# Patient Record
Sex: Female | Born: 1955 | Race: White | Hispanic: No | Marital: Married | Smoking: Never smoker
Health system: Southern US, Community
[De-identification: ages and names within clinical notes are randomized; demographics above are authoritative.]

## PROBLEM LIST (undated history)

## (undated) DIAGNOSIS — G43909 Migraine, unspecified, not intractable, without status migrainosus: Secondary | ICD-10-CM

## (undated) HISTORY — PX: ABDOMINAL HYSTERECTOMY: SHX81

## (undated) HISTORY — PX: ABDOMINAL SURGERY: SHX537

---

## 2016-02-28 ENCOUNTER — Encounter: Payer: Self-pay | Admitting: Emergency Medicine

## 2016-02-28 ENCOUNTER — Emergency Department
Admission: EM | Admit: 2016-02-28 | Discharge: 2016-02-28 | Disposition: A | Discharge status: Home / Self Care | Payer: BLUE CROSS/BLUE SHIELD | Source: Home / Self Care | Attending: Family Medicine | Admitting: Family Medicine

## 2016-02-28 DIAGNOSIS — J019 Acute sinusitis, unspecified: Secondary | ICD-10-CM | POA: Diagnosis not present

## 2016-02-28 DIAGNOSIS — R059 Cough, unspecified: Secondary | ICD-10-CM

## 2016-02-28 DIAGNOSIS — R05 Cough: Secondary | ICD-10-CM

## 2016-02-28 HISTORY — DX: Migraine, unspecified, not intractable, without status migrainosus: G43.909

## 2016-02-28 MED ORDER — BENZONATATE 100 MG PO CAPS: 100.0000 mg | ORAL_CAPSULE | Freq: Three times a day (TID) | ORAL | 0 refills | Status: DC

## 2016-02-28 MED ORDER — AMOXICILLIN 500 MG PO CAPS: 500.0000 mg | ORAL_CAPSULE | Freq: Three times a day (TID) | ORAL | 0 refills | Status: DC

## 2016-02-28 MED ORDER — IPRATROPIUM BROMIDE 0.06 % NA SOLN: 2.0000 | Freq: Four times a day (QID) | NASAL | 12 refills | Status: DC

## 2016-02-28 NOTE — ED Provider Notes (Signed)
CSN: [id number]     Arrival date & time 02/28/16  0845 History   First MD Initiated Contact with Patient 02/28/16 [phone]     Chief Complaint  Patient presents with  . Sinus Problem   (Consider location/radiation/quality/duration/timing/severity/associated sxs/prior Treatment) HPI Dominique Watts is a 60 y.o. female presenting to UC with c/o 1 week of gradually worsening sinus congestion with rhinorrhea, yellow to green mucous, facial pain and pressure with HA on Left side behind her eye.  Mild intermittent productive cough for 1 week.  She has tried OTC nighttime cough/cold medication and tylenol during the day with mild relief. Denies fever, chills, n/v/d.  She believes she may have had a sinus infection in the past, however, she notes she rarely gets sick.  Her husband however has been sick for the last month.    Past Medical History:  Diagnosis Date  . Migraine    Past Surgical History:  Procedure Laterality Date  . ABDOMINAL HYSTERECTOMY    . ABDOMINAL SURGERY     Family History  Problem Relation Age of Onset  . Cancer Mother   . Hypertension Father    Social History  Substance Use Topics  . Smoking status: Never Smoker  . Smokeless tobacco: Never Used  . Alcohol use No   OB History    No data available     Review of Systems  Constitutional: Negative for chills and fever.  HENT: Positive for congestion, ear pain (pressure), postnasal drip, rhinorrhea, sinus pain, sinus pressure and sore throat. Negative for trouble swallowing and voice change.   Respiratory: Positive for cough. Negative for shortness of breath.   Cardiovascular: Negative for chest pain and palpitations.  Gastrointestinal: Negative for abdominal pain, diarrhea, nausea and vomiting.  Musculoskeletal: Negative for arthralgias, back pain and myalgias.  Skin: Negative for rash.  Neurological: Positive for headaches. Negative for dizziness and light-headedness.    Allergies  Patient has no allergy  information on record.  Home Medications   Prior to Admission medications   Medication Sig Start Date End Date Taking? Authorizing Provider  buPROPion (WELLBUTRIN SR) 150 MG 12 hr tablet Take 150 mg by mouth 2 (two) times daily.   Yes Historical Provider, MD  estrogens, conjugated, (PREMARIN) 0.3 MG tablet Take 0.3 mg by mouth daily. Take daily for 21 days then do not take for 7 days.   Yes Historical Provider, MD  propranolol (INNOPRAN XL) 120 MG 24 hr capsule Take 120 mg by mouth at bedtime.   Yes Historical Provider, MD  amoxicillin (AMOXIL) 500 MG capsule Take 1 capsule (500 mg total) by mouth 3 (three) times daily. 02/28/16   Junius FinnerErin O'Malley, PA-C  benzonatate (TESSALON) 100 MG capsule Take 1-2 capsules (100-200 mg total) by mouth every 8 (eight) hours. 02/28/16   Junius FinnerErin O'Malley, PA-C  ipratropium (ATROVENT) 0.06 % nasal spray Place 2 sprays into both nostrils 4 (four) times daily. 02/28/16   Junius FinnerErin O'Malley, PA-C   Meds Ordered and Administered this Visit  Medications - No data to display  BP 134/82 (BP Location: Left Arm)   Pulse (!) 58   Temp 98.4 F (36.9 C) (Oral)   Ht 5\' 3"  (1.6 m)   Wt 160 lb (72.6 kg)   SpO2 99%   BMI 28.34 kg/m  No data found.   Physical Exam  Constitutional: She appears well-developed and well-nourished. No distress.  HENT:  Head: Normocephalic and atraumatic.  Right Ear: Tympanic membrane normal.  Left Ear: Tympanic membrane normal.  Nose:  Mucosal edema present. Right sinus exhibits maxillary sinus tenderness. Right sinus exhibits no frontal sinus tenderness. Left sinus exhibits maxillary sinus tenderness. Left sinus exhibits no frontal sinus tenderness.  Mouth/Throat: Uvula is midline, oropharynx is clear and moist and mucous membranes are normal.  Eyes: Conjunctivae are normal. No scleral icterus.  Neck: Normal range of motion. Neck supple.  Cardiovascular: Normal rate, regular rhythm and normal heart sounds.   Pulmonary/Chest: Effort normal and  breath sounds normal. No stridor. No respiratory distress. She has no wheezes. She has no rales.  Musculoskeletal: Normal range of motion.  Lymphadenopathy:    She has no cervical adenopathy.  Neurological: She is alert.  Skin: Skin is warm and dry. She is not diaphoretic.  Nursing note and vitals reviewed.   Urgent Care Course   Clinical Course     Procedures (including critical care time)  Labs Review Labs Reviewed - No data to display  Imaging Review No results found.    MDM   1. Acute rhinosinusitis   2. Cough    Pt c/o 1 week of worsening sinus and URI symptoms. Sinus tenderness noted on exam. Will  Cover for bacterial cause.  Rx: Amoxicillin , ipratropium nasal spray, and tessalon  Encouraged f/u with PCP In 1 week if not improving, sooner if significantly worsening.   Junius Finnerrin O'Malley, PA-C 02/28/16 1319

## 2016-02-28 NOTE — ED Triage Notes (Signed)
Sinus congestion, runny nose, yellow to green mucus, facial pain and pressure, headache behind left eye, cough x 1 week

## 2019-06-21 ENCOUNTER — Encounter: Payer: Self-pay | Admitting: Emergency Medicine

## 2019-06-21 ENCOUNTER — Emergency Department
Admission: EM | Admit: 2019-06-21 | Discharge: 2019-06-21 | Disposition: A | Discharge status: Home / Self Care | Payer: Medicare HMO | Source: Home / Self Care

## 2019-06-21 ENCOUNTER — Emergency Department (INDEPENDENT_AMBULATORY_CARE_PROVIDER_SITE_OTHER): Payer: Medicare HMO

## 2019-06-21 ENCOUNTER — Other Ambulatory Visit: Payer: Self-pay

## 2019-06-21 DIAGNOSIS — S92351A Displaced fracture of fifth metatarsal bone, right foot, initial encounter for closed fracture: Secondary | ICD-10-CM

## 2019-06-21 DIAGNOSIS — M79671 Pain in right foot: Secondary | ICD-10-CM

## 2019-06-21 MED ORDER — TRAMADOL HCL 50 MG PO TABS: 50.0000 mg | ORAL_TABLET | Freq: Four times a day (QID) | ORAL | 0 refills | Status: AC | PRN

## 2019-06-21 NOTE — Discharge Instructions (Addendum)
  You have a broken bone in your foot. Please wear the boot at all times and remain non-weight bearing until you follow up with Sports Medicine later this week or early next week.  Please call today or tomorrow to schedule an appointment with Sports Medicine later this week or early next week.  You may remove the boot to bath, try sitting on a chair in your shower or in a bath so you do not slip and fall. You may also remove the boot 2-3 times daily to apply a cool compress and elevate your foot to help with pain and swelling.   You may take 500mg  acetaminophen every 4-6 hours or in combination with ibuprofen 400-600mg  every 6-8 hours as needed for pain and inflammation.

## 2019-06-21 NOTE — ED Triage Notes (Signed)
RT foot injury last night steeped off the curb and rolled her foot.

## 2019-06-21 NOTE — ED Provider Notes (Signed)
[facility]    CSN: [id number] Arrival date & time: 06/21/19  1231      History   Chief Complaint Chief Complaint  Patient presents with  . Foot Injury    HPI Dominique Watts is a 64 y.o. female.   HPI  Dominique Watts is a 64 y.o. female presenting to UC with c/o sudden onset Right foot pain and swelling along the lateral aspect of her foot after rolling her foot on a curb last night. She has tried ice, elevation, tylenol, ibuprofen, and excedrin w/o relief. Pain made it difficult for her to sleep. No prior hx of foot fracture in same foot.  Pain is 8/10, worse with ambulation.    Past Medical History:  Diagnosis Date  . Migraine     There are no problems to display for this patient.   Past Surgical History:  Procedure Laterality Date  . ABDOMINAL HYSTERECTOMY    . ABDOMINAL SURGERY      OB History   No obstetric history on file.      Home Medications    Prior to Admission medications   Medication Sig Start Date End Date Taking? Authorizing Provider  cyanocobalamin 1000 MCG tablet Take 1,000 mcg by mouth daily.   Yes [provider]  losartan-hydrochlorothiazide (HYZAAR) 100-12.5 MG tablet Take 1 tablet by mouth daily.   Yes [provider]  buPROPion (WELLBUTRIN SR) 150 MG 12 hr tablet Take 150 mg by mouth 2 (two) times daily.    [provider]  propranolol (INNOPRAN XL) 120 MG 24 hr capsule Take 120 mg by mouth at bedtime.    [provider]  traMADol (ULTRAM) 50 MG tablet Take 1 tablet (50 mg total) by mouth every 6 (six) hours as needed. 06/21/19   Lurene Shadow, PA-C    Family History Family History  Problem Relation Age of Onset  . Cancer Mother   . Hypertension Father     Social History Social History   Tobacco Use  . Smoking status: Never Smoker  . Smokeless tobacco: Never Used  Substance Use Topics  . Alcohol use: No  . Drug use: No     Allergies   Bee venom   Review of Systems Review  of Systems  Musculoskeletal: Positive for arthralgias, gait problem and joint swelling.  Skin: Negative for color change and wound.     Physical Exam Triage Vital Signs ED Triage Vitals  Enc Vitals Group     BP 06/21/19 1251 137/80     Pulse Rate 06/21/19 1251 (!) 58     Resp --      Temp 06/21/19 1251 98.3 F (36.8 C)     Temp Source 06/21/19 1251 Oral     SpO2 06/21/19 1251 98 %     Weight 06/21/19 1253 160 lb (72.6 kg)     Height 06/21/19 1253 5\' 3"  (1.6 m)     Head Circumference --      Peak Flow --      Pain Score 06/21/19 1252 8     Pain Loc --      Pain Edu? --      Excl. in GC? --    No data found.  Updated Vital Signs BP 137/80 (BP Location: Right Arm)   Pulse (!) 58   Temp 98.3 F (36.8 C) (Oral)   Ht 5\' 3"  (1.6 m)   Wt 160 lb (72.6 kg)   SpO2 98%   BMI 28.34 kg/m  Visual Acuity Right Eye Distance:   Left Eye Distance:   Bilateral Distance:    Right Eye Near:   Left Eye Near:    Bilateral Near:     Physical Exam Vitals and nursing note reviewed.  Constitutional:      Appearance: Normal appearance. She is well-developed.  HENT:     Head: Normocephalic and atraumatic.  Cardiovascular:     Rate and Rhythm: Normal rate and regular rhythm.     Pulses:          Dorsalis pedis pulses are 2+ on the right side.       Posterior tibial pulses are 2+ on the right side.  Pulmonary:     Effort: Pulmonary effort is normal.  Musculoskeletal:        General: Swelling and tenderness present. Normal range of motion.     Cervical back: Normal range of motion.     Comments: Right foot: mild edema and tenderness over base of 5th metatarsal. Full ROM ankle and toes.   Skin:    General: Skin is warm and dry.     Capillary Refill: Capillary refill takes less than 2 seconds.     Findings: No bruising or erythema.  Neurological:     Mental Status: She is alert and oriented to person, place, and time.     Sensory: No sensory deficit.  Psychiatric:         Behavior: Behavior normal.      UC Treatments / Results  Labs (all labs ordered are listed, but only abnormal results are displayed) Labs Reviewed - No data to display  EKG   Radiology DG Foot Complete Right  Result Date: 06/21/2019 CLINICAL DATA:  Lateral right foot pain EXAM: RIGHT FOOT COMPLETE - 3+ VIEW COMPARISON:  None. FINDINGS: Acute nondisplaced transversely oriented fracture of the proximal metaphysis of the fifth metatarsal. No evidence of intra-articular extension to the fifth TMT joint. No additional fractures are identified. No dislocation. 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 Soft tissues within normal limits. IMPRESSION: Acute nondisplaced transversely-oriented fracture of the proximal metaphysis of the fifth metatarsal (Jones fracture). Electronically Signed   By: Duanne Guess D.O.   On: 06/21/2019 13:38    Procedures Procedures (including critical care time)  Medications Ordered in UC Medications - No data to display  Initial Impression / Assessment and Plan / UC Course  I have reviewed the triage vital signs and the nursing notes.  Pertinent labs & imaging results that were available during my care of the patient were reviewed by me and considered in my medical decision making (see chart for details).     Discussed imaging with pt Pt placed in walking boot, crutches provided Rx: Tramadol AVS provided  Final Clinical Impressions(s) / UC Diagnoses   Final diagnoses:  Right foot pain  Closed fracture of base of fifth metatarsal bone, right, initial encounter     Discharge Instructions      You have a broken bone in your foot. Please wear the boot at all times and remain non-weight bearing until you follow up with Sports Medicine later this week or early next week.  Please call today or tomorrow to schedule an appointment with Sports Medicine later this week or early next week.  You may remove the boot to bath, try sitting on a  chair in your shower or in a bath so you do not slip and fall. You may also remove the boot 2-3 times daily to  apply a cool compress and elevate your foot to help with pain and swelling.   You may take 500mg  acetaminophen every 4-6 hours or in combination with ibuprofen 400-600mg  every 6-8 hours as needed for pain and inflammation.     ED Prescriptions    Medication Sig Dispense Auth. Provider   traMADol (ULTRAM) 50 MG tablet Take 1 tablet (50 mg total) by mouth every 6 (six) hours as needed. 15 tablet Noe Gens, [place]     I have reviewed the PDMP during this encounter.   Noe Gens, [place] 06/21/19 1647

## 2019-06-23 ENCOUNTER — Other Ambulatory Visit: Payer: Self-pay

## 2019-06-23 ENCOUNTER — Ambulatory Visit (INDEPENDENT_AMBULATORY_CARE_PROVIDER_SITE_OTHER): Payer: Medicare HMO | Admitting: Sports Medicine

## 2019-06-23 DIAGNOSIS — S99192A Other physeal fracture of left metatarsal, initial encounter for closed fracture: Secondary | ICD-10-CM

## 2019-06-23 DIAGNOSIS — S99199A Other physeal fracture of unspecified metatarsal, initial encounter for closed fracture: Secondary | ICD-10-CM | POA: Insufficient documentation

## 2019-06-23 MED ORDER — CALCIUM CARBONATE-VITAMIN D 600-400 MG-UNIT PO TABS: 1.0000 | ORAL_TABLET | Freq: Two times a day (BID) | ORAL | 11 refills | Status: AC

## 2019-06-23 NOTE — Assessment & Plan Note (Signed)
This is a pleasant 64 year old female, 1 week ago she inverted her left foot at a baseball game. She had pain, swelling, bruising, she was seen in urgent care where x-rays showed a transverse fracture through zone 2 of the left fifth metatarsal. This is consistent with a Jones fracture, I explained the pathophysiology, the lack of good blood supply in the likelihood of prolonged healing. On x-ray the fracture does not appear to go completely through the metatarsal, and it appears nondisplaced. We are going to attempt nonoperative measures, she will do nonweightbearing in her boot for the next month followed by the next 2 months of partial weightbearing with a single crutch. She has tramadol and will call me if she needs a refill, adding calcium and vitamin D supplementation, she will continue to avoid smoking, return to see me in 1 month, repeat x-rays before visit.

## 2019-06-23 NOTE — Progress Notes (Signed)
    Procedures performed today:    None.  Independent interpretation of notes and tests performed by another provider:   X-rays personally reviewed and show a nondisplaced, nonangulated fracture through zone 2 of the fifth metacarpal  Brief History, Exam, Impression, and Recommendations:    Jones fracture of left foot This is a pleasant 64 year old female, 1 week ago she inverted her left foot at a baseball game. She had pain, swelling, bruising, she was seen in urgent care where x-rays showed a transverse fracture through zone 2 of the left fifth metatarsal. This is consistent with a Jones fracture, I explained the pathophysiology, the lack of good blood supply in the likelihood of prolonged healing. On x-ray the fracture does not appear to go completely through the metatarsal, and it appears nondisplaced. We are going to attempt nonoperative measures, she will do nonweightbearing in her boot for the next month followed by the next 2 months of partial weightbearing with a single crutch. She has tramadol and will call me if she needs a refill, adding calcium and vitamin D supplementation, she will continue to avoid smoking, return to see me in 1 month, repeat x-rays before visit.    ___________________________________________ Ihor Austin. Benjamin Stain, M.D., ABFM., CAQSM. Primary Care and Sports Medicine [facility]  Adjunct Instructor of Family Medicine  [employer]

## 2019-07-22 ENCOUNTER — Ambulatory Visit (INDEPENDENT_AMBULATORY_CARE_PROVIDER_SITE_OTHER): Payer: Medicare HMO

## 2019-07-22 ENCOUNTER — Ambulatory Visit (INDEPENDENT_AMBULATORY_CARE_PROVIDER_SITE_OTHER): Payer: Medicare HMO | Admitting: Sports Medicine

## 2019-07-22 ENCOUNTER — Other Ambulatory Visit: Payer: Self-pay

## 2019-07-22 ENCOUNTER — Other Ambulatory Visit: Payer: Self-pay | Admitting: Sports Medicine

## 2019-07-22 ENCOUNTER — Encounter: Payer: Self-pay | Admitting: Sports Medicine

## 2019-07-22 DIAGNOSIS — S99192D Other physeal fracture of left metatarsal, subsequent encounter for fracture with routine healing: Secondary | ICD-10-CM

## 2019-07-22 DIAGNOSIS — S99192A Other physeal fracture of left metatarsal, initial encounter for closed fracture: Secondary | ICD-10-CM | POA: Diagnosis not present

## 2019-07-22 NOTE — Assessment & Plan Note (Signed)
This is a pleasant 64 year old female, 4 weeks ago she sustained a fracture through zone 2 of the fifth metatarsal. Symptoms have improved considerably, she has only minimal pain to palpation, no pain with weightbearing. I did review her x-rays personally, she does have some improvement in the fracture line laterally, there is expected bony resorption medially on the fracture as expected with healing. We will transition into a postop shoe today, discontinue crutches, return to see me in 1 month, repeat x-ray before visit.

## 2019-07-22 NOTE — Progress Notes (Signed)
    Procedures performed today:    None.  Independent interpretation of notes and tests performed by another provider:   I did review her x-rays personally, she does have some improvement in the fracture line laterally, there is expected bony resorption medially on the fracture as expected with healing.  Brief History, Exam, Impression, and Recommendations:    Jones fracture of left foot This is a pleasant 64 year old female, 4 weeks ago she sustained a fracture through zone 2 of the fifth metatarsal. Symptoms have improved considerably, she has only minimal pain to palpation, no pain with weightbearing. I did review her x-rays personally, she does have some improvement in the fracture line laterally, there is expected bony resorption medially on the fracture as expected with healing. We will transition into a postop shoe today, discontinue crutches, return to see me in 1 month, repeat x-ray before visit.    ___________________________________________ Ihor Austin. Benjamin Stain, M.D., ABFM., CAQSM. Primary Care and Sports Medicine [facility]  Adjunct Instructor of Family Medicine  [employer]

## 2019-08-19 ENCOUNTER — Other Ambulatory Visit: Payer: Self-pay | Admitting: Sports Medicine

## 2019-08-19 ENCOUNTER — Ambulatory Visit (INDEPENDENT_AMBULATORY_CARE_PROVIDER_SITE_OTHER): Payer: Medicare HMO

## 2019-08-19 ENCOUNTER — Other Ambulatory Visit: Payer: Self-pay

## 2019-08-19 ENCOUNTER — Ambulatory Visit (INDEPENDENT_AMBULATORY_CARE_PROVIDER_SITE_OTHER): Payer: Medicare HMO | Admitting: Sports Medicine

## 2019-08-19 DIAGNOSIS — S99192D Other physeal fracture of left metatarsal, subsequent encounter for fracture with routine healing: Secondary | ICD-10-CM

## 2019-08-19 NOTE — Assessment & Plan Note (Signed)
This is a pleasant 64 year old female, she she sustained fifth metatarsal fracture approximately 7 to 8 weeks ago, today she is doing extremely well in a postop shoe, no tenderness over the fracture. X-rays reviewed, she has good elimination of the fracture line. Return to see me as needed, okay to transition to a regular shoe.

## 2019-08-19 NOTE — Progress Notes (Signed)
    Procedures performed today:    None.  Independent interpretation of notes and tests performed by another provider:   None.  Brief History, Exam, Impression, and Recommendations:    Jones fracture of left foot This is a pleasant 64 year old female, she she sustained fifth metatarsal fracture approximately 7 to 8 weeks ago, today she is doing extremely well in a postop shoe, no tenderness over the fracture. X-rays reviewed, she has good elimination of the fracture line. Return to see me as needed, okay to transition to a regular shoe.    ___________________________________________ Ihor Austin. Benjamin Stain, M.D., ABFM., CAQSM. Primary Care and Sports Medicine [facility]  Adjunct Instructor of Family Medicine  [employer]

## 2020-08-31 ENCOUNTER — Emergency Department
Admission: EM | Admit: 2020-08-31 | Discharge: 2020-08-31 | Disposition: A | Discharge status: Home / Self Care | Payer: Medicare HMO | Source: Home / Self Care

## 2020-08-31 ENCOUNTER — Other Ambulatory Visit: Payer: Self-pay

## 2020-08-31 DIAGNOSIS — R059 Cough, unspecified: Secondary | ICD-10-CM | POA: Diagnosis not present

## 2020-08-31 DIAGNOSIS — J309 Allergic rhinitis, unspecified: Secondary | ICD-10-CM

## 2020-08-31 MED ORDER — FEXOFENADINE HCL 180 MG PO TABS: 180.0000 mg | ORAL_TABLET | Freq: Every day | ORAL | 0 refills | Status: AC

## 2020-08-31 MED ORDER — BENZONATATE 200 MG PO CAPS: 200.0000 mg | ORAL_CAPSULE | Freq: Three times a day (TID) | ORAL | 0 refills | Status: AC | PRN

## 2020-08-31 NOTE — ED Provider Notes (Signed)
[facility]    CSN: [id number] Arrival date & time: 08/31/20  1823      History   Chief Complaint Chief Complaint  Patient presents with   Cough    HPI Dominique Watts is a 65 y.o. female.   HPI 65 year old female presents with cough for 1 day.  Patient is accompanied by her husband this evening whom we are also evaluating for cough.  Past Medical History:  Diagnosis Date   Migraine     Patient Active Problem List   Diagnosis Date Noted   Jones fracture of left foot 06/23/2019    Past Surgical History:  Procedure Laterality Date   ABDOMINAL HYSTERECTOMY     ABDOMINAL SURGERY      OB History   No obstetric history on file.      Home Medications    Prior to Admission medications   Medication Sig Start Date End Date Taking? Authorizing Provider  benzonatate (TESSALON) 200 MG capsule Take 1 capsule (200 mg total) by mouth 3 (three) times daily as needed for up to 7 days for cough. 08/31/20 09/07/20 Yes Trevor Iha, FNP  EPINEPHrine 0.3 mg/0.3 mL IJ SOAJ injection Inject into the muscle. 11/03/17  Yes [provider]  fexofenadine (ALLEGRA ALLERGY) 180 MG tablet Take 1 tablet (180 mg total) by mouth daily for 15 days. 08/31/20 09/15/20 Yes Trevor Iha, FNP  losartan-hydrochlorothiazide (HYZAAR) 50-12.5 MG tablet Take 1 tablet by mouth daily. 05/05/19  Yes [provider]  ondansetron (ZOFRAN-ODT) 8 MG disintegrating tablet Take by mouth. 07/25/16  Yes [provider]  predniSONE (DELTASONE) 10 MG tablet Take 1 tablet by mouth daily. 02/08/20  Yes [provider]  rizatriptan (MAXALT) 5 MG tablet Take by mouth. 02/08/20  Yes [provider]  buPROPion (WELLBUTRIN SR) 150 MG 12 hr tablet Take 150 mg by mouth 2 (two) times daily.    [provider]  Calcium Carbonate-Vitamin D 600-400 MG-UNIT tablet Take 1 tablet by mouth 2 (two) times daily. 06/23/19   Monica Becton, MD  cyanocobalamin 1000 MCG tablet  Take 1,000 mcg by mouth daily.    [provider]  losartan-hydrochlorothiazide (HYZAAR) 100-12.5 MG tablet Take 1 tablet by mouth daily. 50-12.5mg     [provider]  propranolol (INNOPRAN XL) 120 MG 24 hr capsule Take 120 mg by mouth at bedtime.    [provider]  traMADol (ULTRAM) 50 MG tablet Take 1 tablet (50 mg total) by mouth every 6 (six) hours as needed. 06/21/19   Lurene Shadow, PA-C    Family History Family History  Problem Relation Age of Onset   Cancer Mother    Hypertension Father     Social History Social History   Tobacco Use   Smoking status: Never   Smokeless tobacco: Never  Substance Use Topics   Alcohol use: No   Drug use: No     Allergies   Bee venom   Review of Systems Review of Systems  Constitutional: Negative.   HENT:  Positive for postnasal drip.   Eyes: Negative.   Respiratory:  Positive for cough.   Cardiovascular: Negative.   Gastrointestinal: Negative.   Genitourinary: Negative.   Musculoskeletal: Negative.   Skin: Negative.   Neurological: Negative.     Physical Exam Triage Vital Signs ED Triage Vitals  Enc Vitals Group     BP 08/31/20 1845 102/68     Pulse Rate 08/31/20 1845 (!) 57     Resp 08/31/20 1845  17     Temp 08/31/20 1845 99.2 F (37.3 C)     Temp Source 08/31/20 1845 Oral     SpO2 08/31/20 1845 96 %     Weight --      Height --      Head Circumference --      Peak Flow --      Pain Score 08/31/20 1843 0     Pain Loc --      Pain Edu? --      Excl. in GC? --    No data found.  Updated Vital Signs BP 102/68 (BP Location: Left Arm)   Pulse (!) 57   Temp 99.2 F (37.3 C) (Oral)   Resp 17   SpO2 96%   Physical Exam Vitals and nursing note reviewed.  Constitutional:      General: She is not in acute distress.    Appearance: Normal appearance. She is normal weight. She is not ill-appearing.  HENT:     Head: Normocephalic and atraumatic.     Right Ear: Tympanic membrane and ear  canal normal.     Left Ear: Tympanic membrane and ear canal normal.     Nose: Nose normal.     Mouth/Throat:     Mouth: Mucous membranes are moist.     Pharynx: Oropharynx is clear. No oropharyngeal exudate or posterior oropharyngeal erythema.     Comments: Moderate amount of clear drainage of posterior oropharynx noted Eyes:     Extraocular Movements: Extraocular movements intact.     Conjunctiva/sclera: Conjunctivae normal.     Pupils: Pupils are equal, round, and reactive to light.  Cardiovascular:     Rate and Rhythm: Normal rate and regular rhythm.     Pulses: Normal pulses.     Heart sounds: Normal heart sounds.  Pulmonary:     Effort: Pulmonary effort is normal. No respiratory distress.     Breath sounds: Normal breath sounds. No stridor. No wheezing, rhonchi or rales.  Musculoskeletal:        General: Normal range of motion.     Cervical back: Normal range of motion and neck supple. No tenderness.  Lymphadenopathy:     Cervical: No cervical adenopathy.  Skin:    General: Skin is warm and dry.  Neurological:     General: No focal deficit present.     Mental Status: She is alert and oriented to person, place, and time.  Psychiatric:        Mood and Affect: Mood normal.        Behavior: Behavior normal.     UC Treatments / Results  Labs (all labs ordered are listed, but only abnormal results are displayed) Labs Reviewed - No data to display  EKG   Radiology No results found.  Procedures Procedures (including critical care time)  Medications Ordered in UC Medications - No data to display  Initial Impression / Assessment and Plan / UC Course  I have reviewed the triage vital signs and the nursing notes.  Pertinent labs & imaging results that were available during my care of the patient were reviewed by me and considered in my medical decision making (see chart for details).    MDM: 1.  Cough, 2.  Allergic rhinitis.  Patient discharged home, hemodynamically  stable. Final Clinical Impressions(s) / UC Diagnoses   Final diagnoses:  Cough  Allergic rhinitis, unspecified seasonality, unspecified trigger     Discharge Instructions      Advised patient to  take Allegra 180 mg daily for the next 5 days, then as needed for concurrent postnasal drainage/drip.  Advised patient may use Tessalon Perles daily, as needed for cough.     ED Prescriptions     Medication Sig Dispense Auth. Provider   benzonatate (TESSALON) 200 MG capsule Take 1 capsule (200 mg total) by mouth 3 (three) times daily as needed for up to 7 days for cough. 30 capsule Trevor Iha, FNP   fexofenadine [facility] ALLERGY) 180 MG tablet Take 1 tablet (180 mg total) by mouth daily for 15 days. 15 tablet Trevor Iha, FNP      PDMP not reviewed this encounter.   Trevor Iha, FNP 08/31/20 2102

## 2020-08-31 NOTE — ED Triage Notes (Signed)
Pt here today with husband who's been sick with cough. Here today for cough that started yesterday. Excedrin prn. Delsym last night.

## 2020-08-31 NOTE — Discharge Instructions (Addendum)
Advised patient to take Allegra 180 mg daily for the next 5 days, then as needed for concurrent postnasal drainage/drip.  Advised patient may use Tessalon Perles daily, as needed for cough.

## 2020-10-26 IMAGING — DX DG FOOT COMPLETE 3+V*R*
3 series · 3 of 3 positions shown · non-contrast
Comparison: 06/21/2019

CLINICAL DATA: Fracture follow-up

EXAM:
RIGHT FOOT COMPLETE - 3+ VIEW

[foot ap]
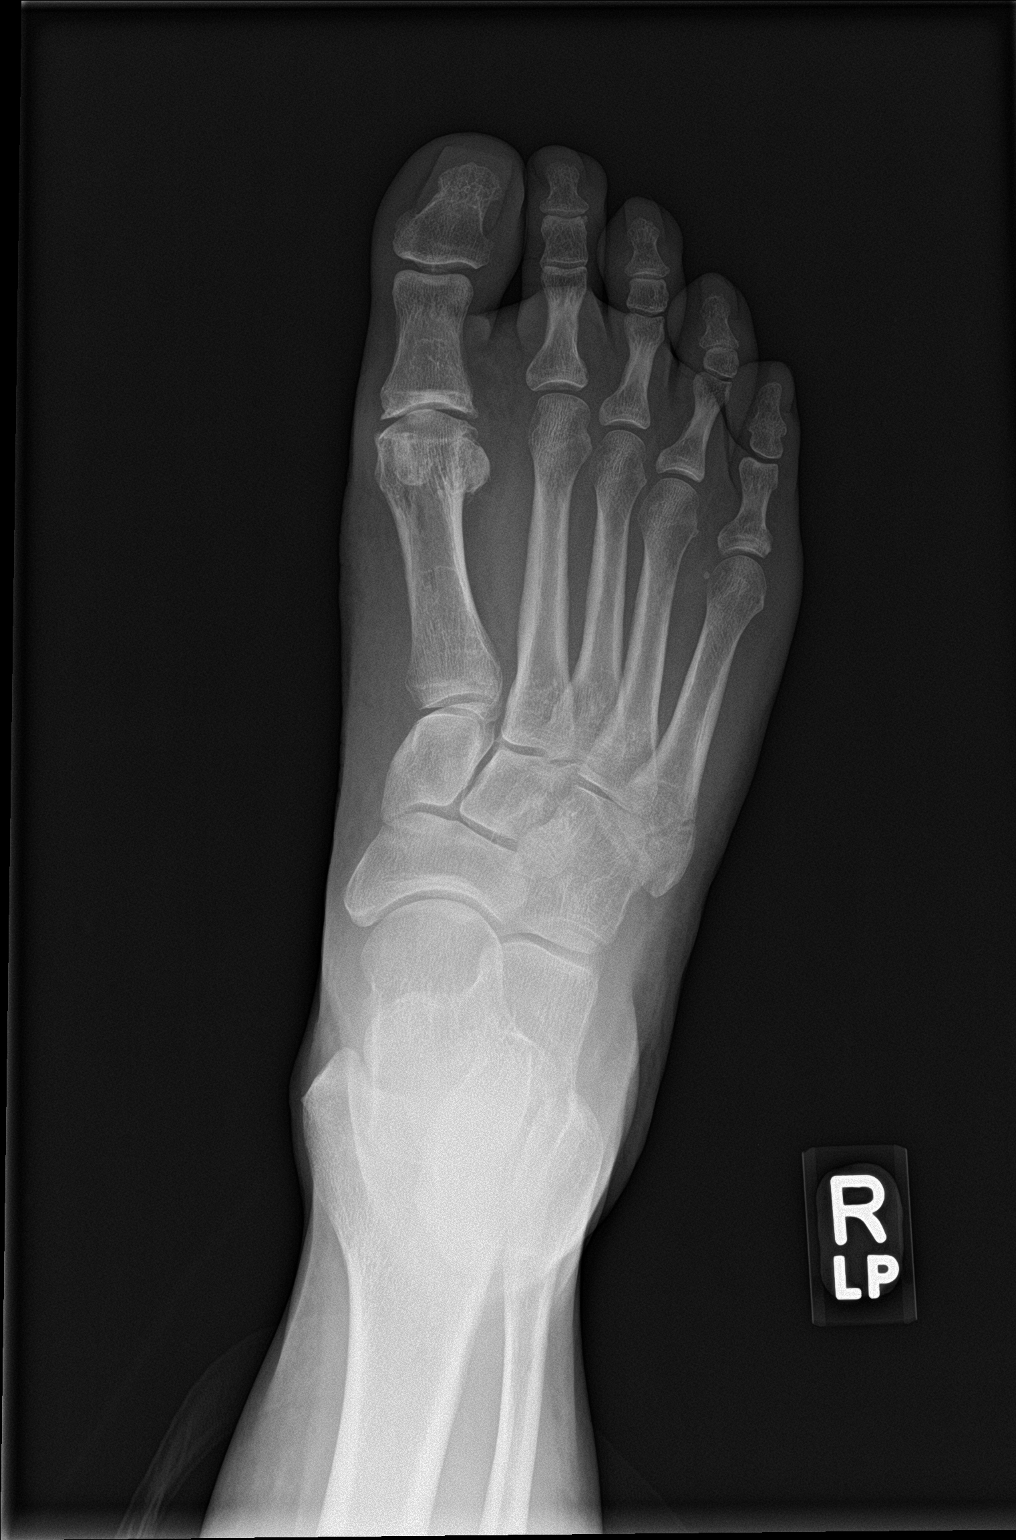

[foot obl]
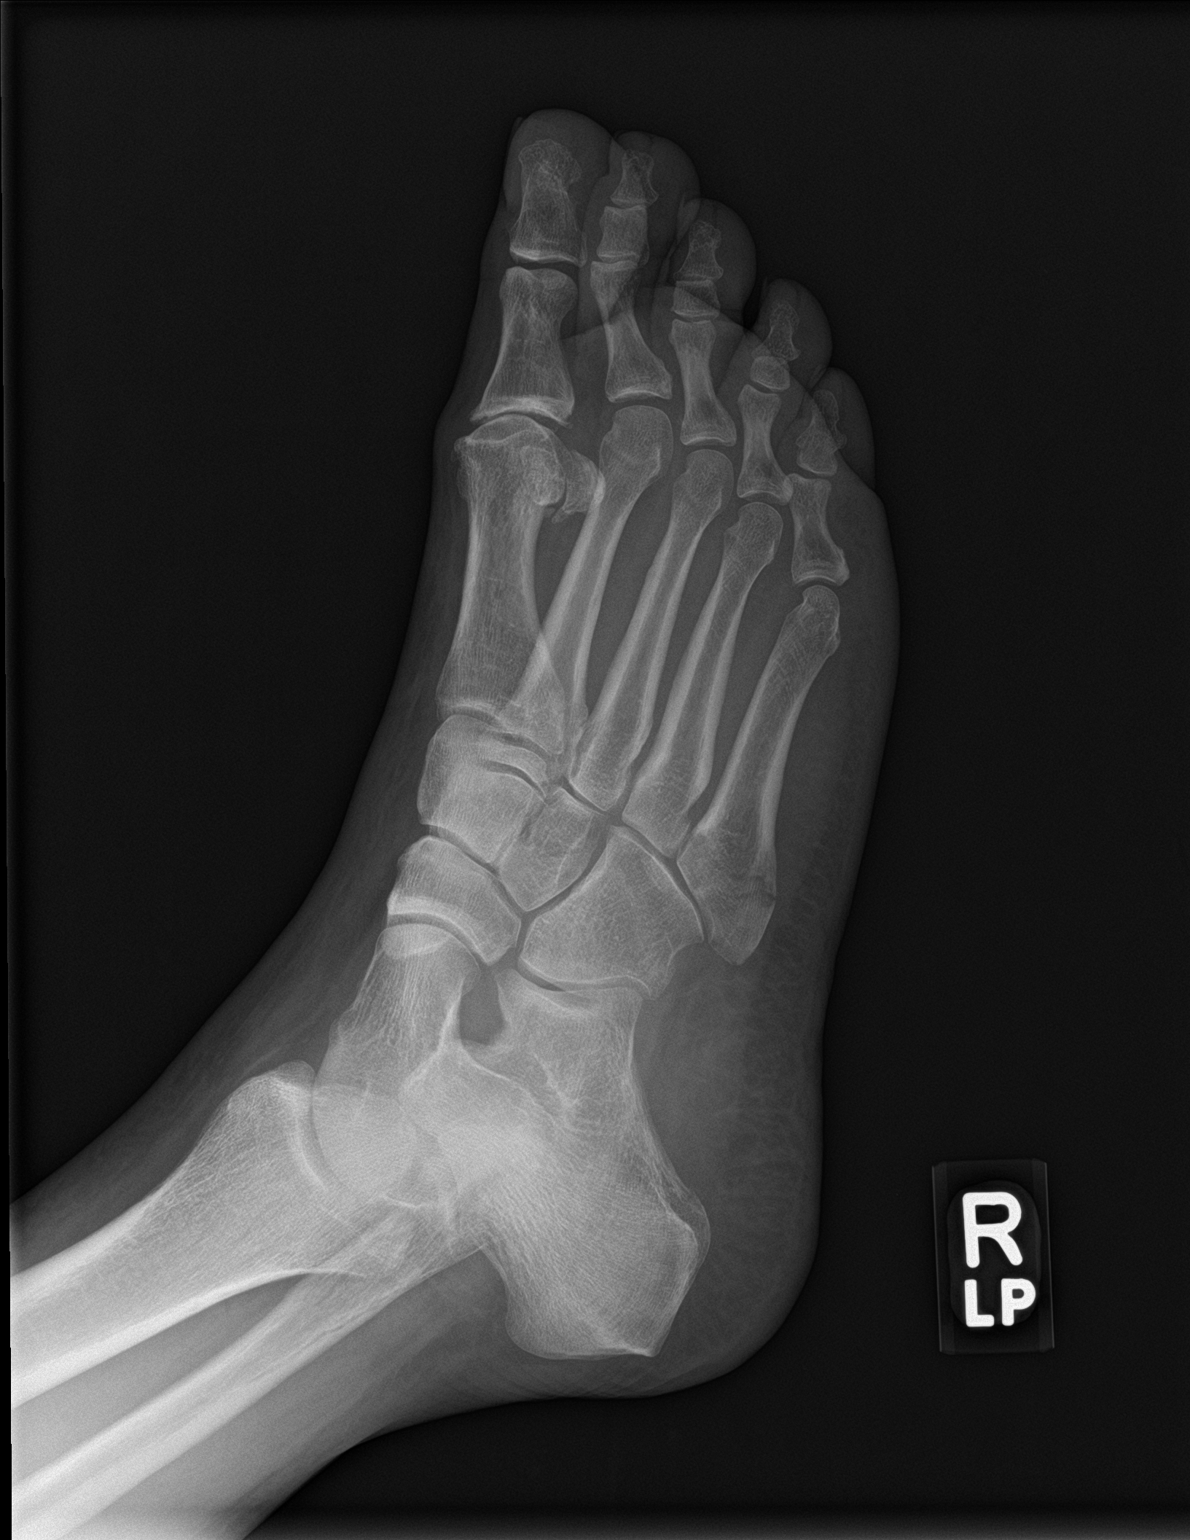

[foot lat]
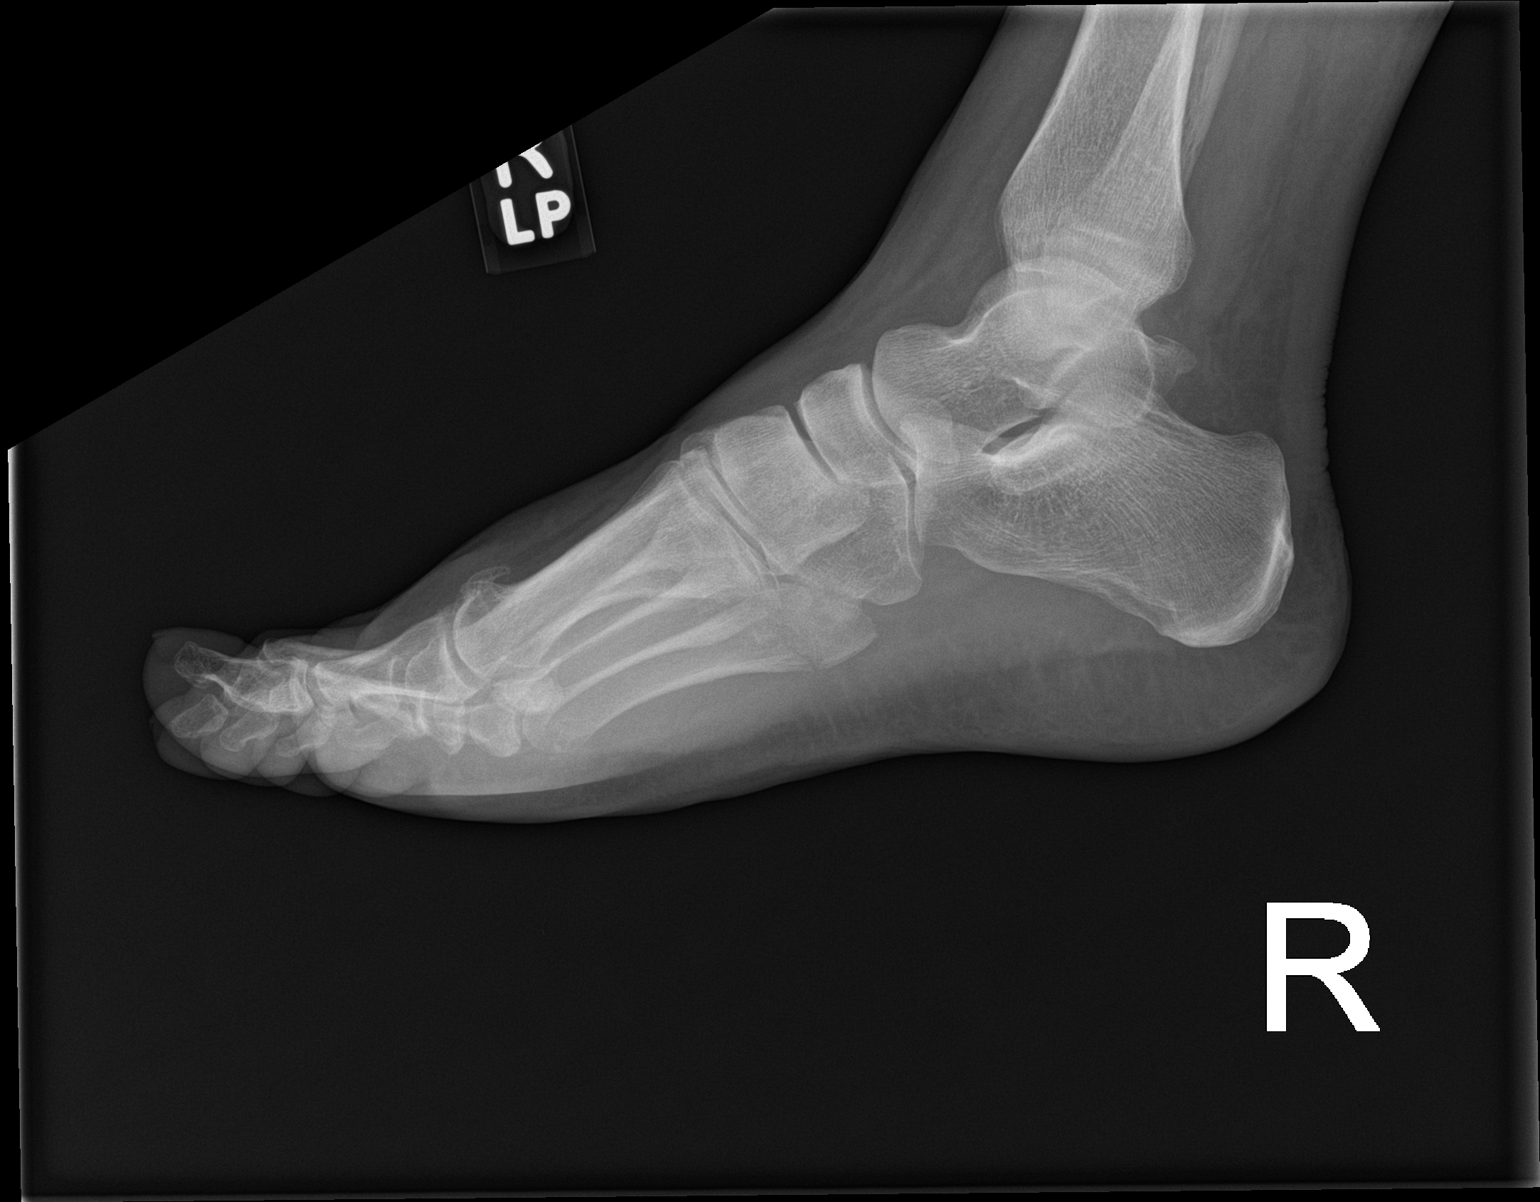

[3 of 3 positions shown; findings below may reference images not displayed]

FINDINGS: Nondisplaced fracture base of fifth metatarsal shows partial
interval healing. No change in alignment from the prior study

Moderate degenerative change first MTP
IMPRESSION: Healing fracture base of fifth metatarsal without displacement.
